# Patient Record
Sex: Female | Born: 1950 | Race: White | Hispanic: No | Marital: Married | Smoking: Former smoker
Health system: Southern US, Community
[De-identification: ages and names within clinical notes are randomized; demographics above are authoritative.]

## PROBLEM LIST (undated history)

## (undated) DIAGNOSIS — T753XXA Motion sickness, initial encounter: Secondary | ICD-10-CM

## (undated) DIAGNOSIS — R208 Other disturbances of skin sensation: Secondary | ICD-10-CM

## (undated) DIAGNOSIS — I10 Essential (primary) hypertension: Secondary | ICD-10-CM

## (undated) DIAGNOSIS — C541 Malignant neoplasm of endometrium: Secondary | ICD-10-CM

## (undated) DIAGNOSIS — R200 Anesthesia of skin: Secondary | ICD-10-CM

## (undated) HISTORY — PX: ABDOMINAL HYSTERECTOMY: SHX81

## (undated) HISTORY — PX: BREAST BIOPSY: SHX20

## (undated) HISTORY — PX: EXCISION MORTON'S NEUROMA: SHX5013

---

## 2004-03-16 DIAGNOSIS — C541 Malignant neoplasm of endometrium: Secondary | ICD-10-CM

## 2004-03-16 HISTORY — DX: Malignant neoplasm of endometrium: C54.1

## 2014-06-05 ENCOUNTER — Ambulatory Visit: Payer: Self-pay | Admitting: Family Medicine

## 2014-06-14 ENCOUNTER — Ambulatory Visit
Admit: 2014-06-14 | Disposition: A | Discharge status: Home / Self Care | Payer: Self-pay | Attending: Obstetrics and Gynecology | Admitting: Obstetrics and Gynecology

## 2014-06-27 ENCOUNTER — Ambulatory Visit
Admit: 2014-06-27 | Disposition: A | Discharge status: Home / Self Care | Payer: Self-pay | Attending: Obstetrics and Gynecology | Admitting: Obstetrics and Gynecology

## 2014-10-26 ENCOUNTER — Encounter: Payer: Self-pay | Admitting: *Deleted

## 2014-10-30 NOTE — Discharge Instructions (Signed)

## 2014-10-31 ENCOUNTER — Encounter
Admission: RE | Disposition: A | Discharge status: Home / Self Care | Payer: Self-pay | Source: Ambulatory Visit | Attending: Gastroenterology

## 2014-10-31 ENCOUNTER — Ambulatory Visit: Payer: PPO | Admitting: Anesthesiology

## 2014-10-31 ENCOUNTER — Ambulatory Visit
Admission: RE | Admit: 2014-10-31 | Discharge: 2014-10-31 | Disposition: A | Discharge status: Home / Self Care | Payer: PPO | Source: Ambulatory Visit | Attending: Gastroenterology | Admitting: Gastroenterology

## 2014-10-31 DIAGNOSIS — Z8379 Family history of other diseases of the digestive system: Secondary | ICD-10-CM | POA: Diagnosis not present

## 2014-10-31 DIAGNOSIS — R7989 Other specified abnormal findings of blood chemistry: Secondary | ICD-10-CM | POA: Insufficient documentation

## 2014-10-31 DIAGNOSIS — Z87891 Personal history of nicotine dependence: Secondary | ICD-10-CM | POA: Insufficient documentation

## 2014-10-31 DIAGNOSIS — Z9889 Other specified postprocedural states: Secondary | ICD-10-CM | POA: Insufficient documentation

## 2014-10-31 DIAGNOSIS — Z90710 Acquired absence of both cervix and uterus: Secondary | ICD-10-CM | POA: Diagnosis not present

## 2014-10-31 DIAGNOSIS — K5730 Diverticulosis of large intestine without perforation or abscess without bleeding: Secondary | ICD-10-CM | POA: Insufficient documentation

## 2014-10-31 DIAGNOSIS — Z8249 Family history of ischemic heart disease and other diseases of the circulatory system: Secondary | ICD-10-CM | POA: Diagnosis not present

## 2014-10-31 DIAGNOSIS — Z1211 Encounter for screening for malignant neoplasm of colon: Secondary | ICD-10-CM | POA: Diagnosis not present

## 2014-10-31 DIAGNOSIS — Z79899 Other long term (current) drug therapy: Secondary | ICD-10-CM | POA: Insufficient documentation

## 2014-10-31 DIAGNOSIS — Z885 Allergy status to narcotic agent status: Secondary | ICD-10-CM | POA: Insufficient documentation

## 2014-10-31 DIAGNOSIS — I10 Essential (primary) hypertension: Secondary | ICD-10-CM | POA: Diagnosis not present

## 2014-10-31 DIAGNOSIS — M5432 Sciatica, left side: Secondary | ICD-10-CM | POA: Diagnosis not present

## 2014-10-31 HISTORY — DX: Motion sickness, initial encounter: T75.3XXA

## 2014-10-31 HISTORY — DX: Anesthesia of skin: R20.0

## 2014-10-31 HISTORY — DX: Essential (primary) hypertension: I10

## 2014-10-31 HISTORY — DX: Other disturbances of skin sensation: R20.8

## 2014-10-31 HISTORY — PX: COLONOSCOPY: SHX5424

## 2014-10-31 SURGERY — COLONOSCOPY
Anesthesia: Monitor Anesthesia Care

## 2014-10-31 MED ORDER — LACTATED RINGERS IV SOLN
INTRAVENOUS | Status: DC
  Administered 2014-10-31: 09:00:00 via INTRAVENOUS

## 2014-10-31 MED ORDER — PROPOFOL 10 MG/ML IV BOLUS
INTRAVENOUS | Status: DC | PRN
  Administered 2014-10-31 (×10): 20 mg via INTRAVENOUS

## 2014-10-31 MED ORDER — LIDOCAINE HCL (CARDIAC) 20 MG/ML IV SOLN
INTRAVENOUS | Status: DC | PRN
  Administered 2014-10-31: 30 mg via INTRAVENOUS

## 2014-10-31 SURGICAL SUPPLY — 30 items

## 2014-10-31 NOTE — Transfer of Care (Signed)
Immediate Anesthesia Transfer of Care Note  Patient: Helen Barr  Procedure(s) Performed: Procedure(s): COLONOSCOPY (N/A)  Patient Location: PACU  Anesthesia Type: MAC  Level of Consciousness: awake, alert  and patient cooperative  Airway and Oxygen Therapy: Patient Spontanous Breathing and Patient connected to supplemental oxygen  Post-op Assessment: Post-op Vital signs reviewed, Patient's Cardiovascular Status Stable, Respiratory Function Stable, Patent Airway and No signs of Nausea or vomiting  Post-op Vital Signs: Reviewed and stable  Complications: No apparent anesthesia complications

## 2014-10-31 NOTE — Anesthesia Postprocedure Evaluation (Deleted)
  Anesthesia Post-op Note  Patient: Helen Barr  Procedure(s) Performed: Procedure(s): COLONOSCOPY (N/A)  Anesthesia type:MAC  Patient location: PACU  Post pain: Pain level controlled  Post assessment: Post-op Vital signs reviewed, Patient's Cardiovascular Status Stable, Respiratory Function Stable, Patent Airway and No signs of Nausea or vomiting  Post vital signs: Reviewed and stable  Last Vitals:  Filed Vitals:   10/31/14 0920  BP: 127/79  Pulse: 66  Temp:   Resp: 15    Level of consciousness: awake, alert  and patient cooperative  Complications: No apparent anesthesia complications

## 2014-10-31 NOTE — Op Note (Signed)
[facility] Patient Name: Helen Barr Procedure Date: 10/31/2014 8:48 AM MRN: [id number] Account #: [id number] Date of Birth: 07/30/1950 Admit Type: Outpatient Age: 64 Room: [facility] OR ROOM 01 Gender: Female Note Status: Finalized Procedure:         Colonoscopy Indications:       Screening for colorectal malignant neoplasm Providers:         Lupita Dawn. Candace Cruise, MD Referring MD:      Shirline Frees (Referring MD) Medicines:         Monitored Anesthesia Care Complications:     No immediate complications. Procedure:         Pre-Anesthesia Assessment:                    - Prior to the procedure, a History and Physical was                     performed, and patient medications, allergies and                     sensitivities were reviewed. The patient's tolerance of                     previous anesthesia was reviewed.                    - The risks and benefits of the procedure and the sedation                     options and risks were discussed with the patient. All                     questions were answered and informed consent was obtained.                    - After reviewing the risks and benefits, the patient was                     deemed in satisfactory condition to undergo the procedure.                    After obtaining informed consent, the colonoscope was                     passed under direct vision. Throughout the procedure, the                     patient's blood pressure, pulse, and oxygen saturations                     were monitored continuously. The Olympus CF-HQ190L                     Colonoscope (S#. [id number]) was introduced through the anus                     and advanced to the the cecum, identified by appendiceal                     orifice and ileocecal valve. The colonoscopy was performed                     without difficulty. The patient tolerated the procedure  well. The quality of the bowel preparation  was fair. Findings:      Multiple small and large-mouthed diverticula were found in the sigmoid       colon.      The exam was otherwise without abnormality. Impression:        - Diverticulosis in the sigmoid colon.                    - The examination was otherwise normal.                    - No specimens collected. Recommendation:    - Discharge patient to home.                    - Repeat colonoscopy in 10 years for surveillance.                    - The findings and recommendations were discussed with the                     patient. Procedure Code(s): --- Professional ---                    [phone], Colonoscopy, flexible; diagnostic, including                     collection of specimen(s) by brushing or washing, when                     performed (separate procedure) Diagnosis Code(s): --- Professional ---                    Z12.11, Encounter for screening for malignant neoplasm of                     colon                    K57.30, Diverticulosis of large intestine without                     perforation or abscess without bleeding CPT copyright 2014 [employer]. All rights reserved. The codes documented in this report are preliminary and upon coder review may  be revised to meet current compliance requirements. Hulen Luster, MD 10/31/2014 9:10:57 AM This report has been signed electronically. Number of Addenda: 0 Note Initiated On: 10/31/2014 8:48 AM Scope Withdrawal Time: 0 hours 10 minutes 17 seconds  Total Procedure Duration: 0 hours 16 minutes 1 second       [facility]

## 2014-10-31 NOTE — Anesthesia Preprocedure Evaluation (Signed)
Anesthesia Evaluation  Patient identified by MRN, date of birth, ID band  Reviewed: Allergy & Precautions, H&P , NPO status , Patient's Chart, lab work & pertinent test results  Airway Mallampati: II  TM Distance: >3 FB Neck ROM: full    Dental no notable dental hx.    Pulmonary former smoker,    Pulmonary exam normal       Cardiovascular hypertension, Rhythm:regular Rate:Normal     Neuro/Psych    GI/Hepatic   Endo/Other    Renal/GU      Musculoskeletal   Abdominal   Peds  Hematology   Anesthesia Other Findings   Reproductive/Obstetrics                             Anesthesia Physical Anesthesia Plan  ASA: II  Anesthesia Plan: MAC   Post-op Pain Management:    Induction:   Airway Management Planned:   Additional Equipment:   Intra-op Plan:   Post-operative Plan:   Informed Consent: I have reviewed the patients History and Physical, chart, labs and discussed the procedure including the risks, benefits and alternatives for the proposed anesthesia with the patient or authorized representative who has indicated his/her understanding and acceptance.     Plan Discussed with: CRNA  Anesthesia Plan Comments:         Anesthesia Quick Evaluation

## 2014-10-31 NOTE — Consult Note (Signed)
    Primary Care Physician:  Sherrin Daisy, MD Primary Gastroenterologist:  Dr. Candace Cruise  Pre-Procedure History & Physical: HPI:  Helen Barr is a 64 y.o. female is here for an colonoscopy.   Past Medical History  Diagnosis Date  . Numbness of left foot     due to injury  . Hypertension   . Motion sickness     amusement park rides    Past Surgical History  Procedure Laterality Date  . Excision morton's neuroma Bilateral   . Abdominal hysterectomy    . Cesarean section      Prior to Admission medications   Medication Sig Start Date End Date Taking? Authorizing Provider  CALCIUM PO Take by mouth.   Yes Historical Provider, MD  Cholecalciferol (VITAMIN D PO) Take by mouth.   Yes Historical Provider, MD  FIBER PO Take by mouth.   Yes Historical Provider, MD  losartan-hydrochlorothiazide (HYZAAR) 50-12.5 MG per tablet Take 1 tablet by mouth daily. AM   Yes Historical Provider, MD  Multiple Vitamins-Minerals (EYE VITAMINS PO) Take by mouth.   Yes Historical Provider, MD    Allergies as of 10/03/2014  . (Not on File)    History reviewed. No pertinent family history.  Social History   Social History  . Marital Status: Married    Spouse Name: N/A  . Number of Children: N/A  . Years of Education: N/A   Occupational History  . Not on file.   Social History Main Topics  . Smoking status: Former Smoker    Quit date: 03/17/1975  . Smokeless tobacco: Not on file  . Alcohol Use: 0.6 oz/week    1 Cans of beer per week  . Drug Use: Not on file  . Sexual Activity: Not on file   Other Topics Concern  . Not on file   Social History Narrative  . No narrative on file    Review of Systems: See HPI, otherwise negative ROS  Physical Exam: BP 127/79 mmHg  Pulse 66  Temp(Src) 97.5 F (36.4 C)  Resp 15  Ht 5' (1.524 m)  Wt 54.432 kg (120 lb)  BMI 23.44 kg/m2  SpO2 100% General:   Alert,  pleasant and cooperative in NAD Head:  Normocephalic and atraumatic. Neck:   Supple; no masses or thyromegaly. Lungs:  Clear throughout to auscultation.    Heart:  Regular rate and rhythm. Abdomen:  Soft, nontender and nondistended. Normal bowel sounds, without guarding, and without rebound.   Neurologic:  Alert and  oriented x4;  grossly normal neurologically.  Impression/Plan: [name] is here for an colonoscopy to be performed for screening.  Risks, benefits, limitations, and alternatives regarding colonoscopy have been reviewed with the patient.  Questions have been answered.  All parties agreeable.   Caulder Wehner, Lupita Dawn, MD  10/31/2014, 10:11 AM

## 2014-10-31 NOTE — Anesthesia Postprocedure Evaluation (Signed)
  Anesthesia Post-op Note  Patient: Helen Barr  Procedure(s) Performed: Procedure(s): COLONOSCOPY (N/A)  Anesthesia type:MAC  Patient location: PACU  Post pain: Pain level controlled  Post assessment: Post-op Vital signs reviewed, Patient's Cardiovascular Status Stable, Respiratory Function Stable, Patent Airway and No signs of Nausea or vomiting  Post vital signs: Reviewed and stable  Last Vitals:  Filed Vitals:   10/31/14 0920  BP: 127/79  Pulse: 66  Resp: 15    Level of consciousness: awake, alert  and patient cooperative  Complications: No apparent anesthesia complications

## 2014-11-01 ENCOUNTER — Encounter: Payer: Self-pay | Admitting: Gastroenterology

## 2014-12-27 ENCOUNTER — Other Ambulatory Visit: Payer: Self-pay | Admitting: Obstetrics and Gynecology

## 2014-12-27 DIAGNOSIS — R928 Other abnormal and inconclusive findings on diagnostic imaging of breast: Secondary | ICD-10-CM

## 2015-02-05 ENCOUNTER — Ambulatory Visit
Admission: RE | Admit: 2015-02-05 | Discharge: 2015-02-05 | Disposition: A | Discharge status: Home / Self Care | Payer: PPO | Source: Ambulatory Visit | Attending: Obstetrics and Gynecology | Admitting: Obstetrics and Gynecology

## 2015-02-05 DIAGNOSIS — R928 Other abnormal and inconclusive findings on diagnostic imaging of breast: Secondary | ICD-10-CM | POA: Diagnosis present

## 2015-02-05 DIAGNOSIS — N63 Unspecified lump in breast: Secondary | ICD-10-CM | POA: Insufficient documentation

## 2015-02-05 HISTORY — DX: Malignant neoplasm of endometrium: C54.1

## 2015-06-26 DIAGNOSIS — Z01419 Encounter for gynecological examination (general) (routine) without abnormal findings: Secondary | ICD-10-CM | POA: Diagnosis not present

## 2015-07-05 ENCOUNTER — Other Ambulatory Visit: Payer: Self-pay | Admitting: Obstetrics and Gynecology

## 2015-07-05 DIAGNOSIS — N63 Unspecified lump in unspecified breast: Secondary | ICD-10-CM

## 2015-07-05 DIAGNOSIS — Z1231 Encounter for screening mammogram for malignant neoplasm of breast: Secondary | ICD-10-CM

## 2015-07-17 ENCOUNTER — Ambulatory Visit
Admission: RE | Admit: 2015-07-17 | Discharge: 2015-07-17 | Disposition: A | Discharge status: Home / Self Care | Payer: PPO | Source: Ambulatory Visit | Attending: Obstetrics and Gynecology | Admitting: Obstetrics and Gynecology

## 2015-07-17 DIAGNOSIS — N6489 Other specified disorders of breast: Secondary | ICD-10-CM | POA: Diagnosis not present

## 2015-07-17 DIAGNOSIS — R928 Other abnormal and inconclusive findings on diagnostic imaging of breast: Secondary | ICD-10-CM | POA: Diagnosis not present

## 2015-07-17 DIAGNOSIS — N63 Unspecified lump in unspecified breast: Secondary | ICD-10-CM

## 2015-12-12 DIAGNOSIS — L821 Other seborrheic keratosis: Secondary | ICD-10-CM | POA: Diagnosis not present

## 2015-12-12 DIAGNOSIS — D1801 Hemangioma of skin and subcutaneous tissue: Secondary | ICD-10-CM | POA: Diagnosis not present

## 2015-12-12 DIAGNOSIS — Z08 Encounter for follow-up examination after completed treatment for malignant neoplasm: Secondary | ICD-10-CM | POA: Diagnosis not present

## 2015-12-12 DIAGNOSIS — L570 Actinic keratosis: Secondary | ICD-10-CM | POA: Diagnosis not present

## 2015-12-12 DIAGNOSIS — Z85828 Personal history of other malignant neoplasm of skin: Secondary | ICD-10-CM | POA: Diagnosis not present

## 2015-12-12 DIAGNOSIS — X32XXXA Exposure to sunlight, initial encounter: Secondary | ICD-10-CM | POA: Diagnosis not present

## 2015-12-30 DIAGNOSIS — L570 Actinic keratosis: Secondary | ICD-10-CM | POA: Diagnosis not present

## 2016-01-27 DIAGNOSIS — X32XXXA Exposure to sunlight, initial encounter: Secondary | ICD-10-CM | POA: Diagnosis not present

## 2016-01-27 DIAGNOSIS — L570 Actinic keratosis: Secondary | ICD-10-CM | POA: Diagnosis not present

## 2016-03-03 DIAGNOSIS — I10 Essential (primary) hypertension: Secondary | ICD-10-CM | POA: Diagnosis not present

## 2016-06-25 IMAGING — US ABDOMEN ULTRASOUND LIMITED
1 series · 14 of 25 positions shown · non-contrast
Comparison: None.

CLINICAL DATA: Elevated liver enzymes.

EXAM:
US ABDOMEN LIMITED - RIGHT UPPER QUADRANT

[Series 1: abdomen ultrasound limited · 0.18mm/px · 14 of 48 slices shown]
[im 1/48]
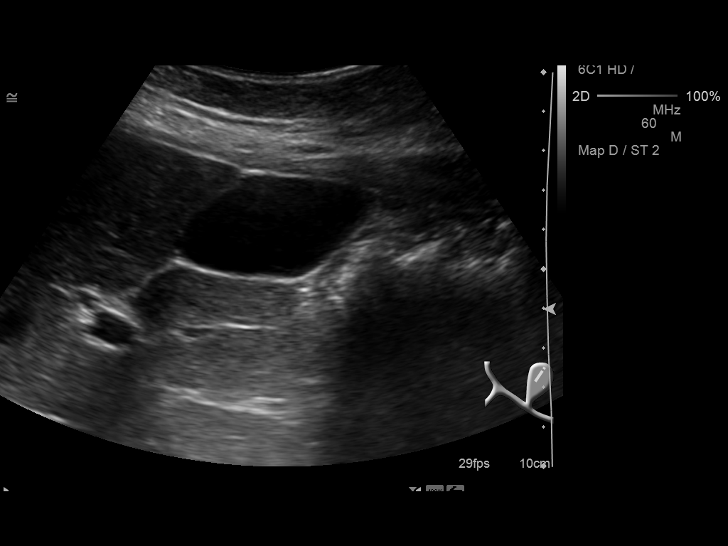
[im 4/48]
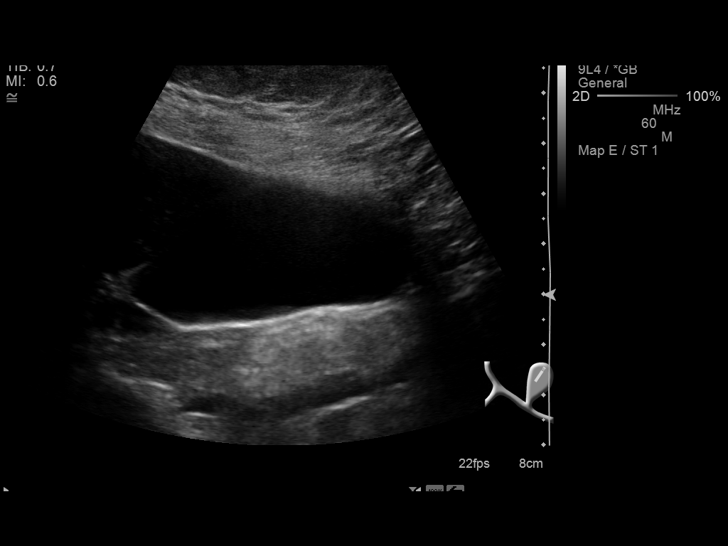
[im 8/48]
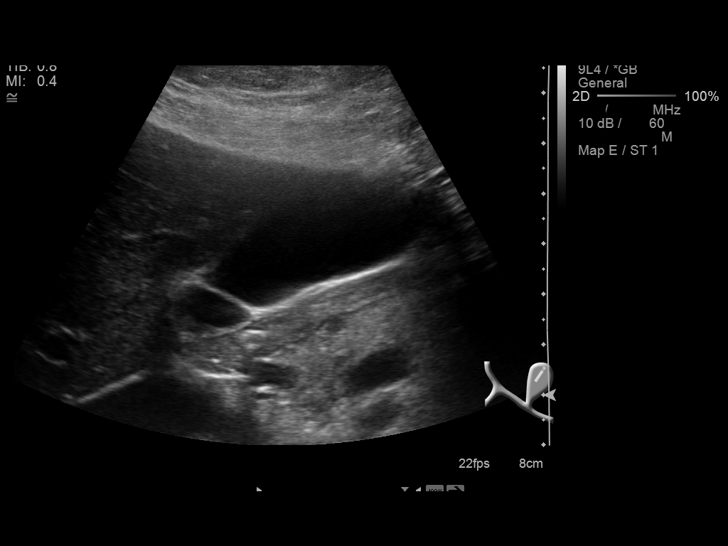
[im 12/48]
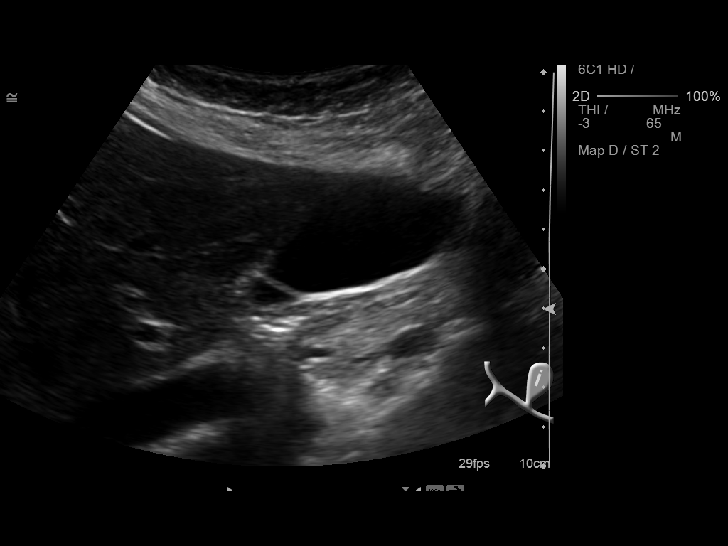
[im 16/48]
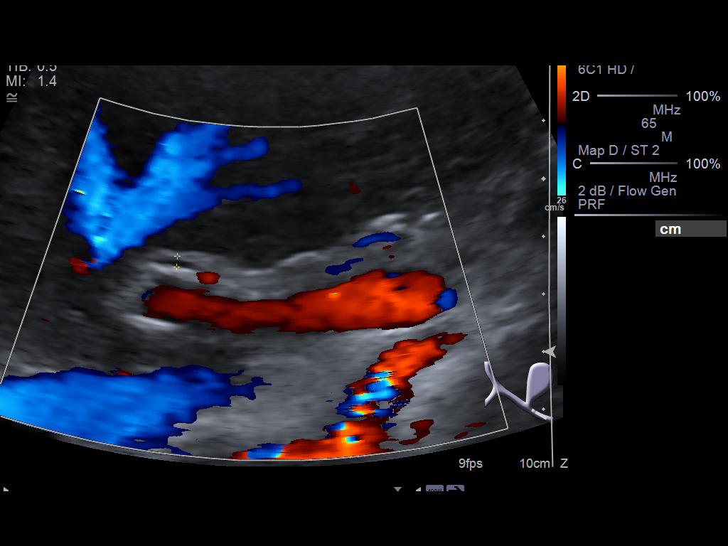
[im 18/48]
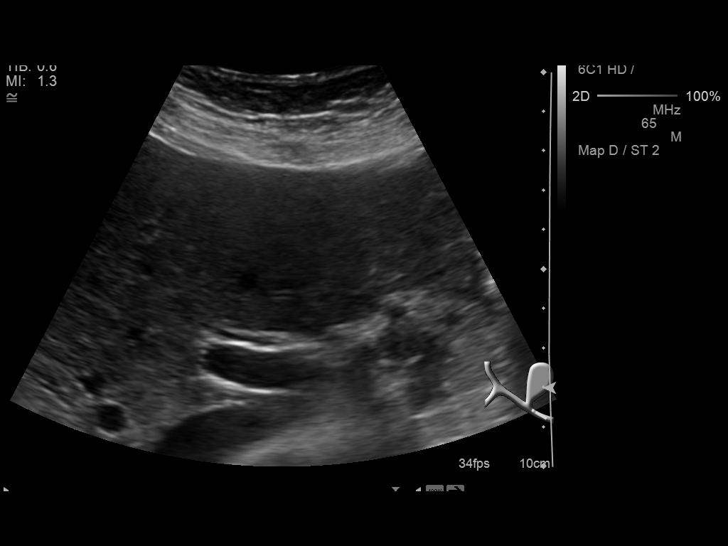
[im 22/48]
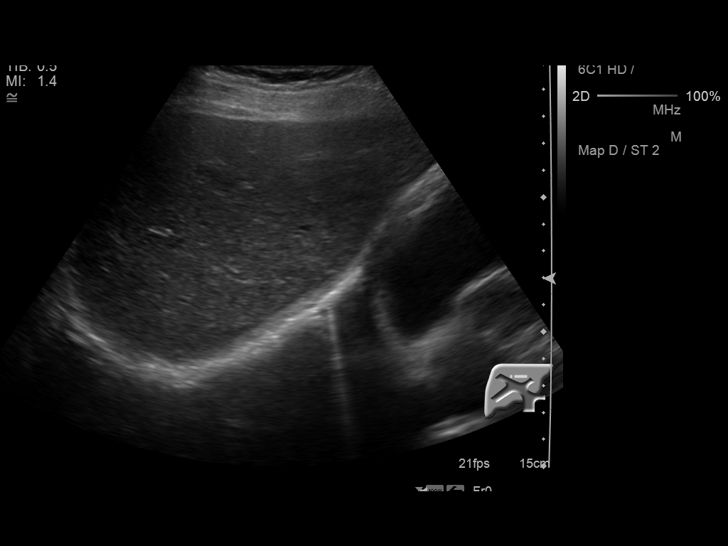
[im 26/48]
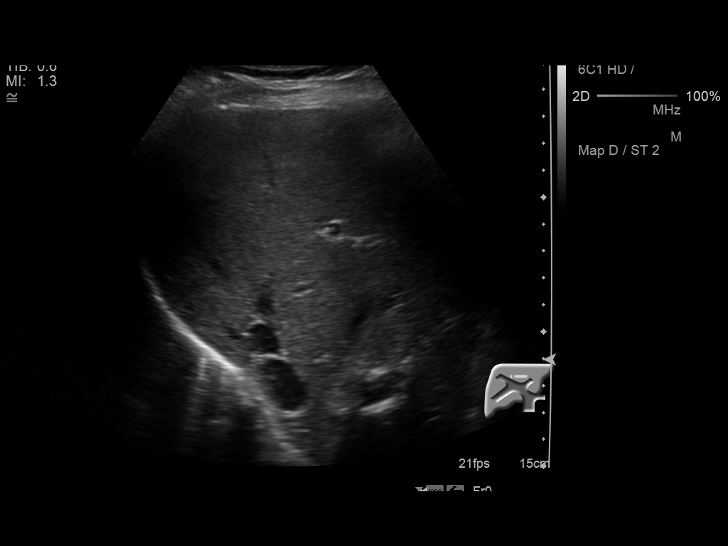
[im 30/48]
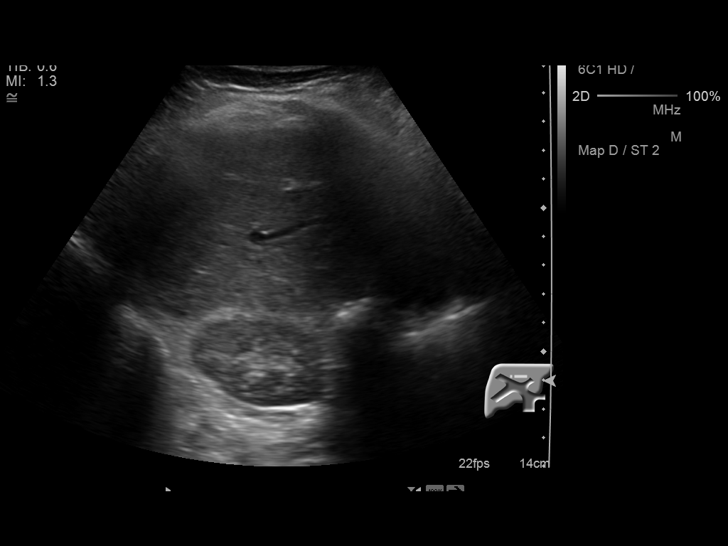
[im 32/48]
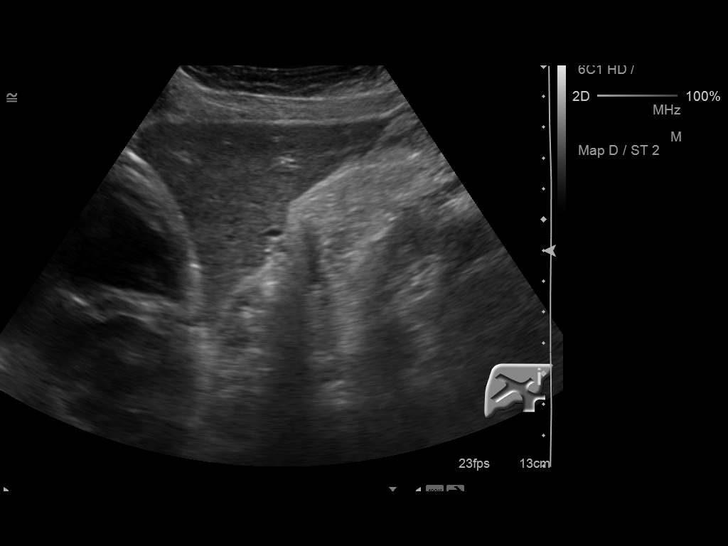
[im 36/48]
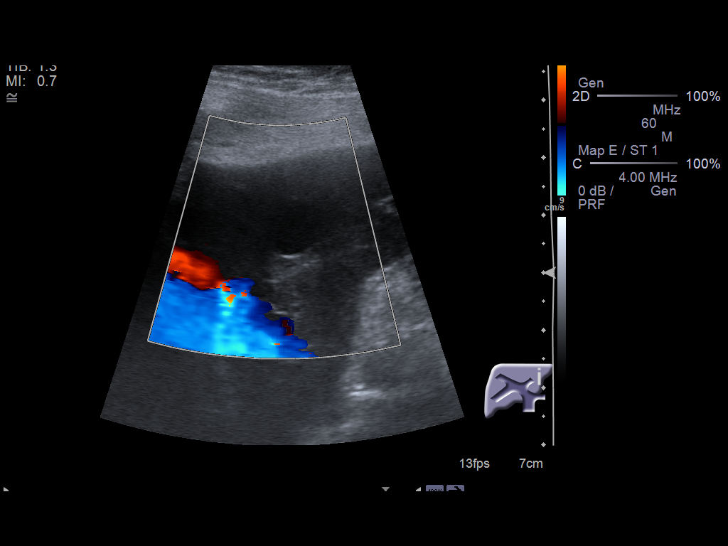
[im 40/48]
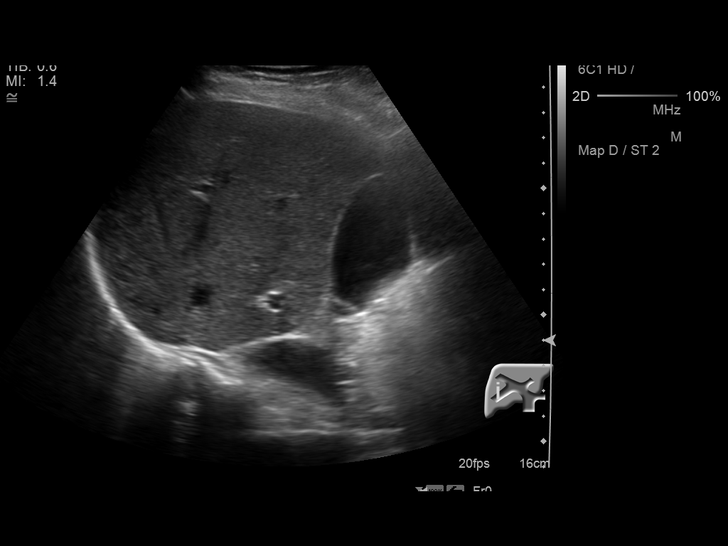
[im 44/48]
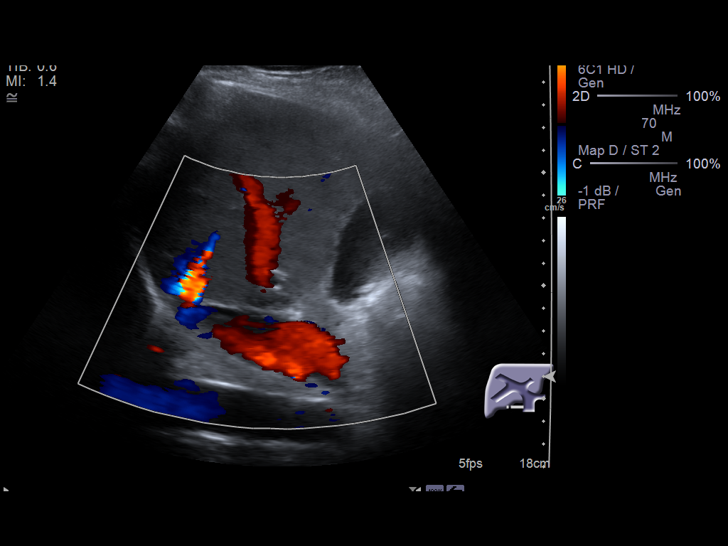
[im 48/48]
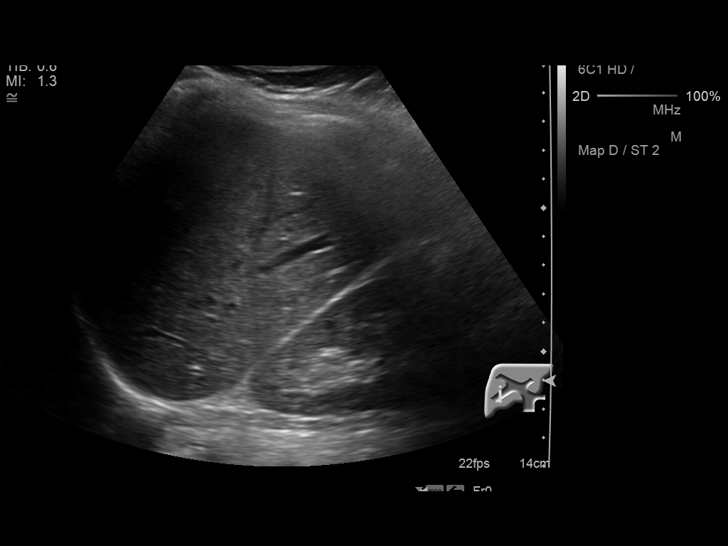

[14 of 25 positions shown; findings below may reference images not displayed]

FINDINGS: Gallbladder:

No gallstones or wall thickening visualized. No sonographic Murphy
sign noted.

Common bile duct:

Diameter: 2.5 mm

Liver:

1.1 cm simple cyst left hepatic lobe. Within normal limits in
parenchymal echogenicity.
IMPRESSION: No significant abnormality identified.

## 2016-06-30 DIAGNOSIS — Z01419 Encounter for gynecological examination (general) (routine) without abnormal findings: Secondary | ICD-10-CM | POA: Diagnosis not present

## 2016-06-30 DIAGNOSIS — Z779 Other contact with and (suspected) exposures hazardous to health: Secondary | ICD-10-CM | POA: Diagnosis not present

## 2016-07-08 ENCOUNTER — Other Ambulatory Visit: Payer: Self-pay | Admitting: Obstetrics and Gynecology

## 2016-07-08 DIAGNOSIS — R928 Other abnormal and inconclusive findings on diagnostic imaging of breast: Secondary | ICD-10-CM

## 2016-07-08 DIAGNOSIS — M8589 Other specified disorders of bone density and structure, multiple sites: Secondary | ICD-10-CM

## 2016-07-22 DIAGNOSIS — M85852 Other specified disorders of bone density and structure, left thigh: Secondary | ICD-10-CM | POA: Diagnosis not present

## 2016-07-22 DIAGNOSIS — Z1231 Encounter for screening mammogram for malignant neoplasm of breast: Secondary | ICD-10-CM | POA: Diagnosis not present

## 2016-07-22 DIAGNOSIS — Z780 Asymptomatic menopausal state: Secondary | ICD-10-CM | POA: Diagnosis not present

## 2016-07-22 DIAGNOSIS — M8589 Other specified disorders of bone density and structure, multiple sites: Secondary | ICD-10-CM | POA: Diagnosis not present

## 2016-12-10 DIAGNOSIS — Z85828 Personal history of other malignant neoplasm of skin: Secondary | ICD-10-CM | POA: Diagnosis not present

## 2016-12-10 DIAGNOSIS — D2261 Melanocytic nevi of right upper limb, including shoulder: Secondary | ICD-10-CM | POA: Diagnosis not present

## 2016-12-10 DIAGNOSIS — D225 Melanocytic nevi of trunk: Secondary | ICD-10-CM | POA: Diagnosis not present

## 2016-12-10 DIAGNOSIS — D2271 Melanocytic nevi of right lower limb, including hip: Secondary | ICD-10-CM | POA: Diagnosis not present

## 2017-03-05 DIAGNOSIS — I10 Essential (primary) hypertension: Secondary | ICD-10-CM | POA: Diagnosis not present

## 2017-03-05 DIAGNOSIS — Z23 Encounter for immunization: Secondary | ICD-10-CM | POA: Diagnosis not present

## 2017-03-05 DIAGNOSIS — D72829 Elevated white blood cell count, unspecified: Secondary | ICD-10-CM | POA: Diagnosis not present

## 2017-03-05 DIAGNOSIS — Z13220 Encounter for screening for lipoid disorders: Secondary | ICD-10-CM | POA: Diagnosis not present

## 2017-03-05 DIAGNOSIS — R200 Anesthesia of skin: Secondary | ICD-10-CM | POA: Diagnosis not present

## 2017-03-05 DIAGNOSIS — Z79899 Other long term (current) drug therapy: Secondary | ICD-10-CM | POA: Diagnosis not present

## 2017-06-11 DIAGNOSIS — D72829 Elevated white blood cell count, unspecified: Secondary | ICD-10-CM | POA: Diagnosis not present

## 2017-07-14 DIAGNOSIS — Z01419 Encounter for gynecological examination (general) (routine) without abnormal findings: Secondary | ICD-10-CM | POA: Diagnosis not present

## 2017-07-22 ENCOUNTER — Other Ambulatory Visit: Payer: Self-pay | Admitting: Obstetrics and Gynecology

## 2017-07-22 DIAGNOSIS — E2839 Other primary ovarian failure: Secondary | ICD-10-CM

## 2017-07-22 DIAGNOSIS — Z1231 Encounter for screening mammogram for malignant neoplasm of breast: Secondary | ICD-10-CM

## 2017-07-27 ENCOUNTER — Ambulatory Visit
Admission: RE | Admit: 2017-07-27 | Discharge: 2017-07-27 | Disposition: A | Discharge status: Home / Self Care | Payer: PPO | Source: Ambulatory Visit | Attending: Obstetrics and Gynecology | Admitting: Obstetrics and Gynecology

## 2017-07-27 ENCOUNTER — Encounter (INDEPENDENT_AMBULATORY_CARE_PROVIDER_SITE_OTHER): Payer: Self-pay

## 2017-07-27 DIAGNOSIS — Z1231 Encounter for screening mammogram for malignant neoplasm of breast: Secondary | ICD-10-CM | POA: Diagnosis not present

## 2017-11-25 DIAGNOSIS — H2513 Age-related nuclear cataract, bilateral: Secondary | ICD-10-CM | POA: Diagnosis not present

## 2018-02-04 DIAGNOSIS — Z85828 Personal history of other malignant neoplasm of skin: Secondary | ICD-10-CM | POA: Diagnosis not present

## 2018-02-04 DIAGNOSIS — D2271 Melanocytic nevi of right lower limb, including hip: Secondary | ICD-10-CM | POA: Diagnosis not present

## 2018-02-04 DIAGNOSIS — Z08 Encounter for follow-up examination after completed treatment for malignant neoplasm: Secondary | ICD-10-CM | POA: Diagnosis not present

## 2018-02-04 DIAGNOSIS — L821 Other seborrheic keratosis: Secondary | ICD-10-CM | POA: Diagnosis not present

## 2018-02-04 DIAGNOSIS — D2262 Melanocytic nevi of left upper limb, including shoulder: Secondary | ICD-10-CM | POA: Diagnosis not present

## 2018-02-04 DIAGNOSIS — D2272 Melanocytic nevi of left lower limb, including hip: Secondary | ICD-10-CM | POA: Diagnosis not present

## 2018-02-04 DIAGNOSIS — D2261 Melanocytic nevi of right upper limb, including shoulder: Secondary | ICD-10-CM | POA: Diagnosis not present

## 2018-02-04 DIAGNOSIS — D225 Melanocytic nevi of trunk: Secondary | ICD-10-CM | POA: Diagnosis not present

## 2018-05-18 DIAGNOSIS — I10 Essential (primary) hypertension: Secondary | ICD-10-CM | POA: Diagnosis not present

## 2018-05-18 DIAGNOSIS — G5760 Lesion of plantar nerve, unspecified lower limb: Secondary | ICD-10-CM | POA: Diagnosis not present

## 2018-05-18 DIAGNOSIS — R200 Anesthesia of skin: Secondary | ICD-10-CM | POA: Diagnosis not present

## 2018-05-18 DIAGNOSIS — Z0000 Encounter for general adult medical examination without abnormal findings: Secondary | ICD-10-CM | POA: Diagnosis not present

## 2018-05-18 DIAGNOSIS — N6310 Unspecified lump in the right breast, unspecified quadrant: Secondary | ICD-10-CM | POA: Diagnosis not present

## 2018-05-18 DIAGNOSIS — M8580 Other specified disorders of bone density and structure, unspecified site: Secondary | ICD-10-CM | POA: Diagnosis not present

## 2018-07-21 DIAGNOSIS — Z01419 Encounter for gynecological examination (general) (routine) without abnormal findings: Secondary | ICD-10-CM | POA: Diagnosis not present

## 2018-08-15 ENCOUNTER — Other Ambulatory Visit: Payer: Self-pay | Admitting: Obstetrics and Gynecology

## 2018-08-15 DIAGNOSIS — Z1231 Encounter for screening mammogram for malignant neoplasm of breast: Secondary | ICD-10-CM

## 2018-11-01 ENCOUNTER — Ambulatory Visit
Admission: RE | Admit: 2018-11-01 | Discharge: 2018-11-01 | Disposition: A | Discharge status: Home / Self Care | Payer: PPO | Source: Ambulatory Visit | Attending: Obstetrics and Gynecology | Admitting: Obstetrics and Gynecology

## 2018-11-01 ENCOUNTER — Other Ambulatory Visit: Payer: Self-pay

## 2018-11-01 DIAGNOSIS — Z1231 Encounter for screening mammogram for malignant neoplasm of breast: Secondary | ICD-10-CM | POA: Insufficient documentation

## 2020-11-21 IMAGING — MG DIGITAL SCREENING BILATERAL MAMMOGRAM WITH TOMO AND CAD
6 of 10 series · 6 of 30 positions shown · non-contrast
Comparison: Previous exam(s).

ACR Breast Density Category a: The breast tissue is almost entirely
fatty.

CLINICAL DATA: Screening.

EXAM:
DIGITAL SCREENING BILATERAL MAMMOGRAM WITH TOMO AND CAD

[R MLO synth-2D]
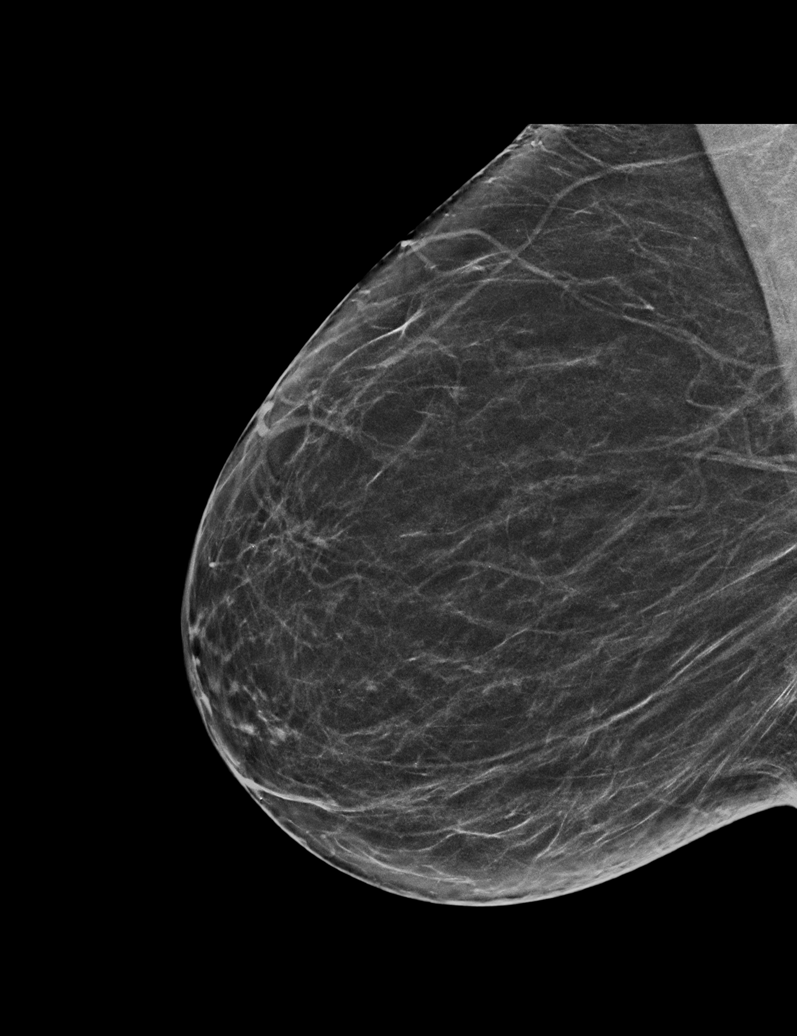

[L MLO synth-2D]
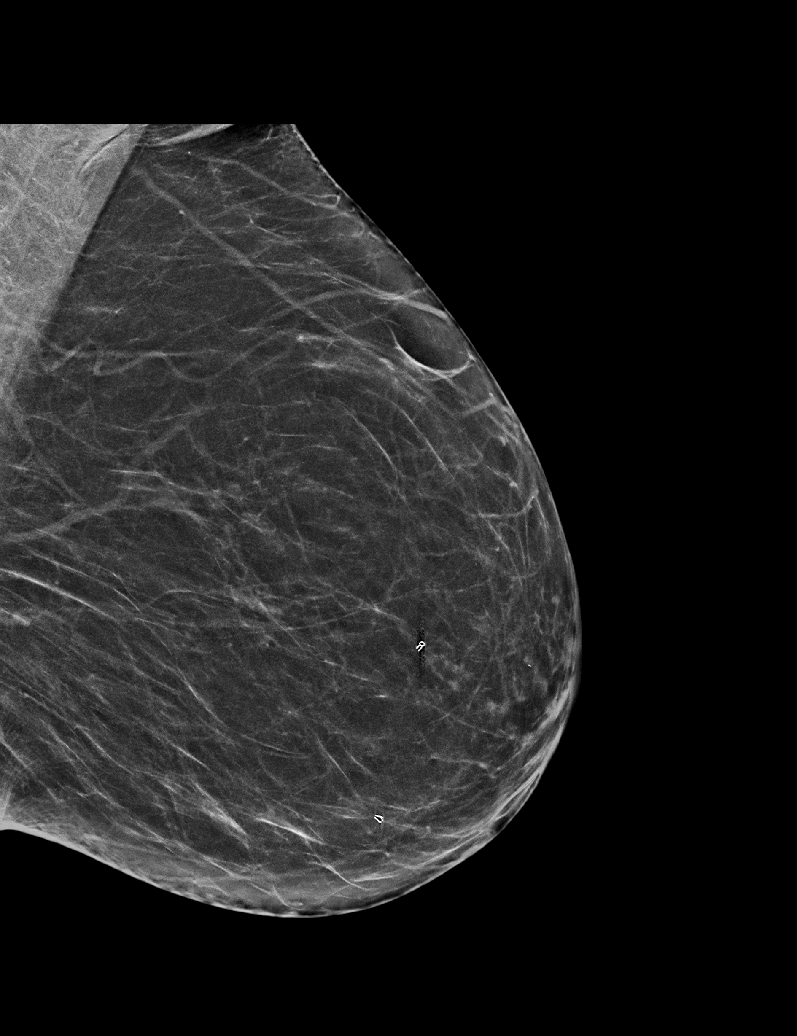

[R CC synth-2D]
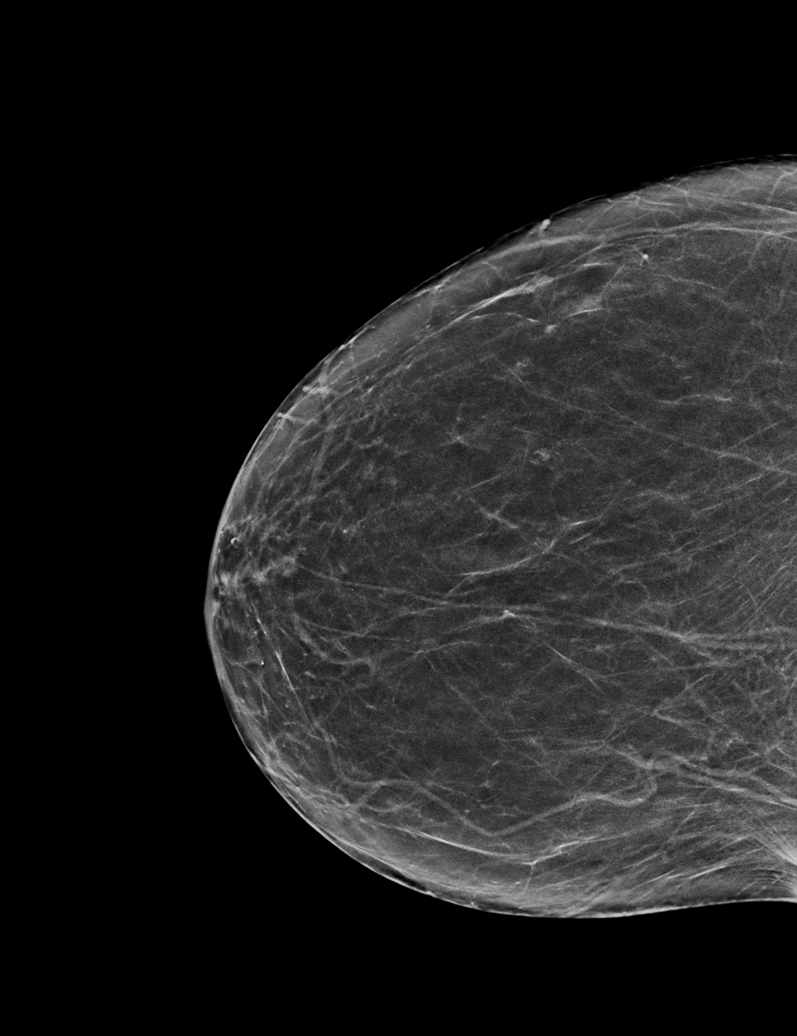

[L CC synth-2D (1 of 2)]
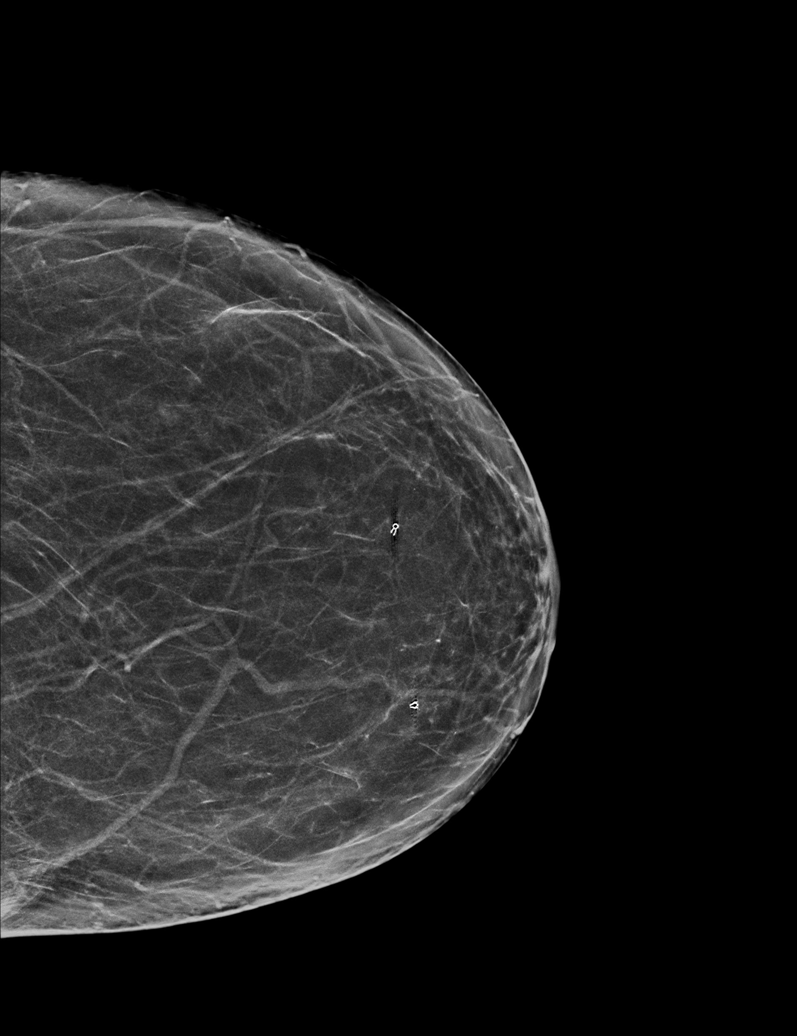

[L CC synth-2D (2 of 2)]
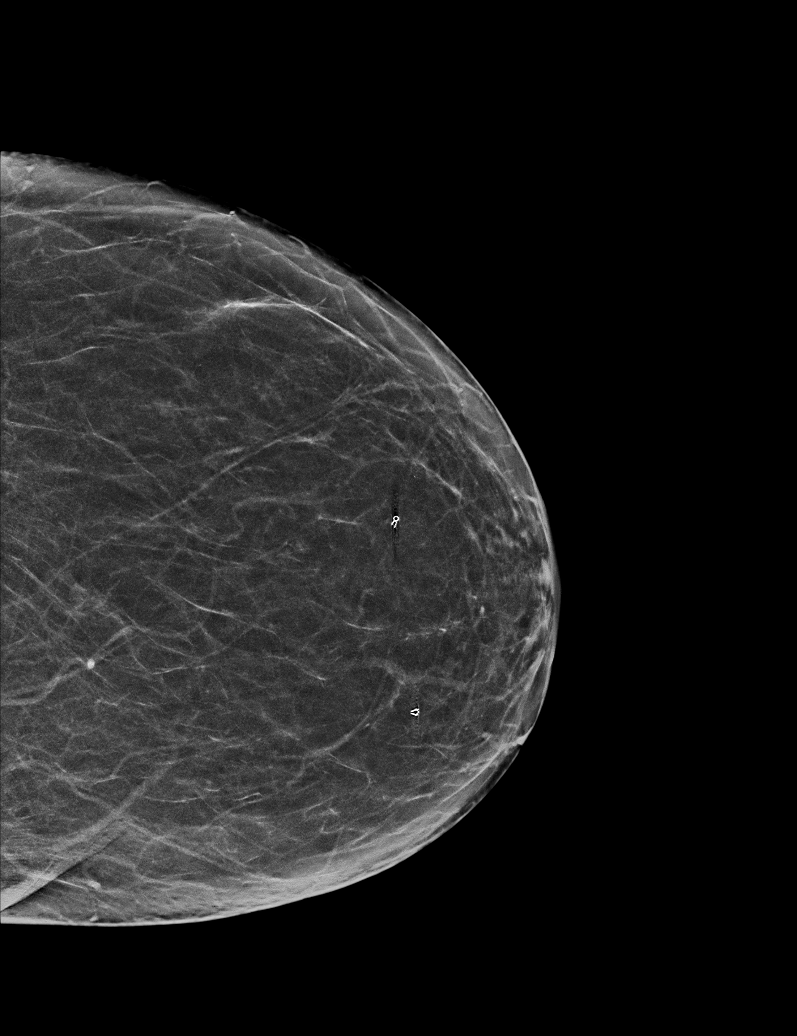

[R CC tomo · tomo slice 29/56.0]
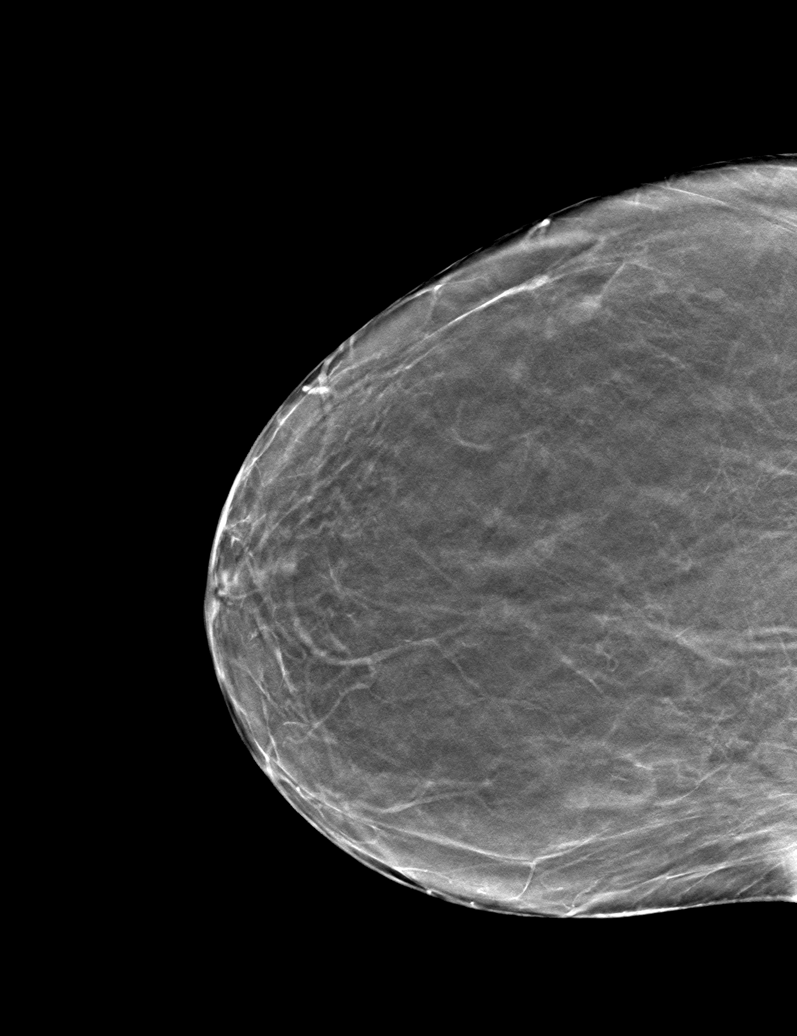

[6 of 30 positions shown; findings below may reference images not displayed]

FINDINGS: There are no findings suspicious for malignancy. Images were
processed with CAD.
IMPRESSION: No mammographic evidence of malignancy. A result letter of this
screening mammogram will be mailed directly to the patient.

RECOMMENDATION:
Screening mammogram in one year. (Code:[id number]-[id number])

BI-RADS CATEGORY  1: Negative.
